# Patient Record
Sex: Male | Born: 1965 | Race: White | Hispanic: No | Marital: Single
Health system: Southern US, Community
[De-identification: ages and names within clinical notes are randomized; demographics above are authoritative.]

---

## 2016-09-01 ENCOUNTER — Encounter: Payer: Self-pay | Admitting: Internal Medicine

## 2016-09-01 ENCOUNTER — Emergency Department: Payer: Medicaid Other

## 2016-09-01 ENCOUNTER — Inpatient Hospital Stay: Payer: Medicaid Other

## 2016-09-01 ENCOUNTER — Inpatient Hospital Stay
Admission: EM | Admit: 2016-09-01 | Discharge: 2016-09-15 | DRG: 291 | Disposition: E | Payer: Medicaid Other | Attending: Internal Medicine | Admitting: Internal Medicine

## 2016-09-01 ENCOUNTER — Inpatient Hospital Stay (HOSPITAL_COMMUNITY)
Admit: 2016-09-01 | Discharge: 2016-09-01 | Disposition: A | Discharge status: Home / Self Care | Payer: Medicaid Other | Attending: Critical Care Medicine | Admitting: Critical Care Medicine

## 2016-09-01 DIAGNOSIS — G931 Anoxic brain damage, not elsewhere classified: Secondary | ICD-10-CM | POA: Diagnosis present

## 2016-09-01 DIAGNOSIS — G9382 Brain death: Secondary | ICD-10-CM | POA: Diagnosis present

## 2016-09-01 DIAGNOSIS — N179 Acute kidney failure, unspecified: Secondary | ICD-10-CM | POA: Diagnosis not present

## 2016-09-01 DIAGNOSIS — I4901 Ventricular fibrillation: Secondary | ICD-10-CM | POA: Diagnosis present

## 2016-09-01 DIAGNOSIS — Z66 Do not resuscitate: Secondary | ICD-10-CM | POA: Diagnosis present

## 2016-09-01 DIAGNOSIS — I469 Cardiac arrest, cause unspecified: Secondary | ICD-10-CM

## 2016-09-01 DIAGNOSIS — J80 Acute respiratory distress syndrome: Secondary | ICD-10-CM | POA: Diagnosis not present

## 2016-09-01 DIAGNOSIS — F17200 Nicotine dependence, unspecified, uncomplicated: Secondary | ICD-10-CM | POA: Diagnosis present

## 2016-09-01 DIAGNOSIS — D696 Thrombocytopenia, unspecified: Secondary | ICD-10-CM | POA: Diagnosis present

## 2016-09-01 DIAGNOSIS — J9621 Acute and chronic respiratory failure with hypoxia: Secondary | ICD-10-CM | POA: Diagnosis present

## 2016-09-01 DIAGNOSIS — R739 Hyperglycemia, unspecified: Secondary | ICD-10-CM | POA: Diagnosis present

## 2016-09-01 DIAGNOSIS — E874 Mixed disorder of acid-base balance: Secondary | ICD-10-CM | POA: Diagnosis present

## 2016-09-01 DIAGNOSIS — R680 Hypothermia, not associated with low environmental temperature: Secondary | ICD-10-CM | POA: Diagnosis present

## 2016-09-01 DIAGNOSIS — F419 Anxiety disorder, unspecified: Secondary | ICD-10-CM | POA: Diagnosis present

## 2016-09-01 DIAGNOSIS — D649 Anemia, unspecified: Secondary | ICD-10-CM | POA: Diagnosis present

## 2016-09-01 DIAGNOSIS — I4510 Unspecified right bundle-branch block: Secondary | ICD-10-CM | POA: Diagnosis present

## 2016-09-01 DIAGNOSIS — G4730 Sleep apnea, unspecified: Secondary | ICD-10-CM | POA: Diagnosis present

## 2016-09-01 DIAGNOSIS — J449 Chronic obstructive pulmonary disease, unspecified: Secondary | ICD-10-CM | POA: Diagnosis present

## 2016-09-01 DIAGNOSIS — R570 Cardiogenic shock: Principal | ICD-10-CM | POA: Diagnosis present

## 2016-09-01 DIAGNOSIS — Z9911 Dependence on respirator [ventilator] status: Secondary | ICD-10-CM | POA: Diagnosis not present

## 2016-09-01 DIAGNOSIS — R945 Abnormal results of liver function studies: Secondary | ICD-10-CM | POA: Diagnosis present

## 2016-09-01 DIAGNOSIS — R34 Anuria and oliguria: Secondary | ICD-10-CM | POA: Diagnosis present

## 2016-09-01 DIAGNOSIS — R579 Shock, unspecified: Secondary | ICD-10-CM

## 2016-09-01 DIAGNOSIS — Z4659 Encounter for fitting and adjustment of other gastrointestinal appliance and device: Secondary | ICD-10-CM

## 2016-09-01 LAB — ETHANOL: Alcohol, Ethyl (B): <5 mg/dL (ref <5)

## 2016-09-01 LAB — COMPREHENSIVE METABOLIC PANEL
ALBUMIN: 3.3 g/dL — AB (ref 3.5–5.0)
ALK PHOS: 102 U/L (ref 38–126)
ALT: 173 U/L — ABNORMAL HIGH (ref 17–63)
AST: 306 U/L — AB (ref 15–41)
Anion gap: 17 — ABNORMAL HIGH (ref 5–15)
BILIRUBIN TOTAL: 0.6 mg/dL (ref 0.3–1.2)
BUN: 9 mg/dL (ref 6–20)
CALCIUM: 9.4 mg/dL (ref 8.9–10.3)
CO2: 41 mmol/L — AB (ref 22–32)
Chloride: 84 mmol/L — ABNORMAL LOW (ref 101–111)
Creatinine, Ser: 1.1 mg/dL (ref 0.61–1.24)
GFR calc Af Amer: >60 mL/min (ref >60)
GFR calc non Af Amer: >60 mL/min (ref >60)
Glucose, Bld: 229 mg/dL — ABNORMAL HIGH (ref 65–99)
Potassium: 4.6 mmol/L (ref 3.5–5.1)
SODIUM: 142 mmol/L (ref 135–145)
TOTAL PROTEIN: 5.8 g/dL — AB (ref 6.5–8.1)

## 2016-09-01 LAB — CBC WITH DIFFERENTIAL/PLATELET
BASOS PCT: 0 %
Band Neutrophils: 0 %
Basophils Absolute: 0 10*3/uL (ref 0–0.1)
Blasts: 0 %
EOS PCT: 1 %
Eosinophils Absolute: 0.1 10*3/uL (ref 0–0.7)
HEMATOCRIT: 40.9 % (ref 40.0–52.0)
HEMOGLOBIN: 11.7 g/dL — AB (ref 13.0–18.0)
LYMPHS ABS: 1.5 10*3/uL (ref 1.0–3.6)
Lymphocytes Relative: 17 %
MCH: 26.8 pg (ref 26.0–34.0)
MCHC: 28.7 g/dL — ABNORMAL LOW (ref 32.0–36.0)
MCV: 93.2 fL (ref 80.0–100.0)
METAMYELOCYTES PCT: 2 %
MONO ABS: 0.5 10*3/uL (ref 0.2–1.0)
MYELOCYTES: 1 %
Monocytes Relative: 6 %
NEUTROS PCT: 73 %
NRBC: 0 /100{WBCs}
Neutro Abs: 6.9 10*3/uL — ABNORMAL HIGH (ref 1.4–6.5)
Other: 0 %
PROMYELOCYTES ABS: 0 %
Platelets: 112 10*3/uL — ABNORMAL LOW (ref 150–440)
RBC: 4.39 MIL/uL — AB (ref 4.40–5.90)
RDW: 18.3 % — ABNORMAL HIGH (ref 11.5–14.5)
WBC: 9 10*3/uL (ref 3.8–10.6)

## 2016-09-01 LAB — GLUCOSE, CAPILLARY
GLUCOSE-CAPILLARY: 339 mg/dL — AB (ref 65–99)
GLUCOSE-CAPILLARY: 253 mg/dL — AB (ref 65–99)
GLUCOSE-CAPILLARY: 270 mg/dL — AB (ref 65–99)
GLUCOSE-CAPILLARY: 114 mg/dL — AB (ref 65–99)
Glucose-Capillary: 118 mg/dL — ABNORMAL HIGH (ref 65–99)
Glucose-Capillary: 191 mg/dL — ABNORMAL HIGH (ref 65–99)
Glucose-Capillary: 368 mg/dL — ABNORMAL HIGH (ref 65–99)

## 2016-09-01 LAB — PROTIME-INR
INR: 1.02
PROTHROMBIN TIME: 13.4 s (ref 11.4–15.2)

## 2016-09-01 LAB — URINE DRUG SCREEN, QUALITATIVE (ARMC ONLY)
AMPHETAMINES, UR SCREEN: NOT DETECTED
BENZODIAZEPINE, UR SCRN: POSITIVE — AB
Barbiturates, Ur Screen: NOT DETECTED
Cannabinoid 50 Ng, Ur ~~LOC~~: NOT DETECTED
Cocaine Metabolite,Ur ~~LOC~~: NOT DETECTED
MDMA (Ecstasy)Ur Screen: NOT DETECTED
METHADONE SCREEN, URINE: NOT DETECTED
Opiate, Ur Screen: NOT DETECTED
PHENCYCLIDINE (PCP) UR S: NOT DETECTED
TRICYCLIC, UR SCREEN: NOT DETECTED

## 2016-09-01 LAB — MRSA PCR SCREENING: MRSA by PCR: NEGATIVE

## 2016-09-01 LAB — TROPONIN I: TROPONIN I: 0.03 ng/mL — AB (ref <0.03)

## 2016-09-01 LAB — ACETAMINOPHEN LEVEL: Acetaminophen (Tylenol), Serum: <10 ug/mL — ABNORMAL LOW (ref 10–30)

## 2016-09-01 LAB — LACTIC ACID, PLASMA
Lactic Acid, Venous: 12.6 mmol/L (ref 0.5–1.9)
Lactic Acid, Venous: 9.4 mmol/L (ref 0.5–1.9)

## 2016-09-01 LAB — SALICYLATE LEVEL: Salicylate Lvl: <7 mg/dL (ref 2.8–30.0)

## 2016-09-01 LAB — ECHOCARDIOGRAM COMPLETE: Weight: 4056.46 oz

## 2016-09-01 LAB — PROCALCITONIN: Procalcitonin: 0.1 ng/mL

## 2016-09-01 LAB — AMMONIA: AMMONIA: 25 umol/L (ref 9–35)

## 2016-09-01 MED ORDER — DEXTROSE 5 % IV SOLN
300.0000 mg | Freq: Once | INTRAVENOUS | Status: AC
  Administered 2016-09-01: 300 mg via INTRAVENOUS
  Filled 2016-09-01: qty 6

## 2016-09-01 MED ORDER — BUDESONIDE 0.25 MG/2ML IN SUSP
0.2500 mg | Freq: Two times a day (BID) | RESPIRATORY_TRACT | Status: DC
  Administered 2016-09-01: 0.25 mg via RESPIRATORY_TRACT
  Filled 2016-09-01: qty 2

## 2016-09-01 MED ORDER — IPRATROPIUM-ALBUTEROL 0.5-2.5 (3) MG/3ML IN SOLN
3.0000 mL | Freq: Four times a day (QID) | RESPIRATORY_TRACT | Status: DC
  Administered 2016-09-01: 3 mL via RESPIRATORY_TRACT
  Filled 2016-09-01: qty 3

## 2016-09-01 MED ORDER — ONDANSETRON HCL 4 MG PO TABS: 4.0000 mg | ORAL_TABLET | Freq: Four times a day (QID) | ORAL | Status: DC | PRN

## 2016-09-01 MED ORDER — NOREPINEPHRINE BITARTRATE 1 MG/ML IV SOLN
0.0000 ug/min | Freq: Once | INTRAVENOUS | Status: AC
  Administered 2016-09-01: 4 ug/min via INTRAVENOUS

## 2016-09-01 MED ORDER — ACETAMINOPHEN 325 MG PO TABS
650.0000 mg | ORAL_TABLET | Freq: Four times a day (QID) | ORAL | Status: DC | PRN
  Administered 2016-09-02 (×2): 650 mg via ORAL
  Filled 2016-09-01 (×2): qty 2

## 2016-09-01 MED ORDER — MAGNESIUM SULFATE 2 GM/50ML IV SOLN
INTRAVENOUS | Status: AC
  Administered 2016-09-01: 04:00:00
  Filled 2016-09-01: qty 50

## 2016-09-01 MED ORDER — ACETAMINOPHEN 650 MG RE SUPP: 650.0000 mg | Freq: Four times a day (QID) | RECTAL | Status: DC | PRN

## 2016-09-01 MED ORDER — SODIUM CHLORIDE 0.9 % IV BOLUS (SEPSIS)
1000.0000 mL | Freq: Once | INTRAVENOUS | Status: AC
  Administered 2016-09-01: 1000 mL via INTRAVENOUS

## 2016-09-01 MED ORDER — NOREPINEPHRINE 4 MG/250ML-% IV SOLN
0.0000 ug/min | INTRAVENOUS | Status: DC
  Administered 2016-09-01: 17 ug/min via INTRAVENOUS

## 2016-09-01 MED ORDER — SODIUM BICARBONATE 8.4 % IV SOLN
INTRAVENOUS | Status: AC | PRN
  Administered 2016-09-01: 100 meq via INTRAVENOUS

## 2016-09-01 MED ORDER — VASOPRESSIN 20 UNIT/ML IV SOLN
0.0300 [IU]/min | INTRAVENOUS | Status: DC
  Administered 2016-09-01 – 2016-09-02 (×2): 0.03 [IU]/min via INTRAVENOUS
  Filled 2016-09-01 (×3): qty 2

## 2016-09-01 MED ORDER — AMIODARONE HCL IN DEXTROSE 360-4.14 MG/200ML-% IV SOLN
60.0000 mg/h | INTRAVENOUS | Status: DC
  Administered 2016-09-01: 60 mg/h via INTRAVENOUS
  Filled 2016-09-01: qty 200

## 2016-09-01 MED ORDER — AMIODARONE HCL IN DEXTROSE 360-4.14 MG/200ML-% IV SOLN
60.0000 mg/h | Freq: Once | INTRAVENOUS | Status: AC
  Administered 2016-09-01: 60 mg/h via INTRAVENOUS

## 2016-09-01 MED ORDER — ATROPINE SULFATE 1 MG/10ML IJ SOSY
1.0000 mg | PREFILLED_SYRINGE | Freq: Once | INTRAMUSCULAR | Status: AC
  Administered 2016-09-01: 1 mg via INTRAVENOUS

## 2016-09-01 MED ORDER — DEXTROSE 5 % IV SOLN: 60.0000 mg/h | Freq: Once | INTRAVENOUS | Status: DC

## 2016-09-01 MED ORDER — ORAL CARE MOUTH RINSE
15.0000 mL | OROMUCOSAL | Status: DC
  Administered 2016-09-01 – 2016-09-02 (×7): 15 mL via OROMUCOSAL

## 2016-09-01 MED ORDER — DOPAMINE-DEXTROSE 3.2-5 MG/ML-% IV SOLN
INTRAVENOUS | Status: AC
  Administered 2016-09-01: 20 ug/kg/min via INTRAVENOUS
  Filled 2016-09-01: qty 250

## 2016-09-01 MED ORDER — IPRATROPIUM-ALBUTEROL 0.5-2.5 (3) MG/3ML IN SOLN: 3.0000 mL | Freq: Four times a day (QID) | RESPIRATORY_TRACT | Status: DC | PRN

## 2016-09-01 MED ORDER — SODIUM CHLORIDE 0.9% FLUSH
3.0000 mL | Freq: Two times a day (BID) | INTRAVENOUS | Status: DC
  Administered 2016-09-01 (×2): 3 mL via INTRAVENOUS

## 2016-09-01 MED ORDER — ONDANSETRON HCL 4 MG/2ML IJ SOLN: 4.0000 mg | Freq: Four times a day (QID) | INTRAMUSCULAR | Status: DC | PRN

## 2016-09-01 MED ORDER — DOPAMINE-DEXTROSE 3.2-5 MG/ML-% IV SOLN
0.0000 ug/kg/min | INTRAVENOUS | Status: DC
  Administered 2016-09-01 (×3): 20 ug/kg/min via INTRAVENOUS
  Filled 2016-09-01 (×2): qty 250

## 2016-09-01 MED ORDER — NOREPINEPHRINE BITARTRATE 1 MG/ML IV SOLN
0.0000 ug/min | INTRAVENOUS | Status: DC
  Administered 2016-09-01 (×2): 15 ug/min via INTRAVENOUS
  Administered 2016-09-01: 30 ug/min via INTRAVENOUS
  Administered 2016-09-01: 8 ug/min via INTRAVENOUS
  Administered 2016-09-02: 15 ug/min via INTRAVENOUS
  Administered 2016-09-02: 12 ug/min via INTRAVENOUS
  Filled 2016-09-01 (×6): qty 16

## 2016-09-01 MED ORDER — METHYLPREDNISOLONE SODIUM SUCC 40 MG IJ SOLR
40.0000 mg | Freq: Two times a day (BID) | INTRAMUSCULAR | Status: DC
  Administered 2016-09-01: 40 mg via INTRAVENOUS
  Filled 2016-09-01: qty 1

## 2016-09-01 MED ORDER — FAMOTIDINE IN NACL 20-0.9 MG/50ML-% IV SOLN
20.0000 mg | INTRAVENOUS | Status: DC
  Administered 2016-09-02: 20 mg via INTRAVENOUS
  Filled 2016-09-01: qty 50

## 2016-09-01 MED ORDER — SODIUM CHLORIDE 0.9% FLUSH: 10.0000 mL | INTRAVENOUS | Status: DC | PRN

## 2016-09-01 MED ORDER — CHLORHEXIDINE GLUCONATE 0.12% ORAL RINSE (MEDLINE KIT)
15.0000 mL | Freq: Two times a day (BID) | OROMUCOSAL | Status: DC
  Administered 2016-09-01 – 2016-09-02 (×3): 15 mL via OROMUCOSAL

## 2016-09-01 MED ORDER — IPRATROPIUM-ALBUTEROL 0.5-2.5 (3) MG/3ML IN SOLN
3.0000 mL | Freq: Four times a day (QID) | RESPIRATORY_TRACT | Status: DC
  Administered 2016-09-01 – 2016-09-02 (×4): 3 mL via RESPIRATORY_TRACT
  Filled 2016-09-01 (×5): qty 3

## 2016-09-01 MED ORDER — SODIUM CHLORIDE 0.9% FLUSH
10.0000 mL | Freq: Two times a day (BID) | INTRAVENOUS | Status: DC
  Administered 2016-09-01: 10 mL
  Administered 2016-09-01: 40 mL

## 2016-09-01 MED ORDER — AMIODARONE HCL IN DEXTROSE 360-4.14 MG/200ML-% IV SOLN
INTRAVENOUS | Status: AC
  Administered 2016-09-01: 60 mg/h via INTRAVENOUS
  Filled 2016-09-01: qty 200

## 2016-09-01 MED ORDER — NOREPINEPHRINE 4 MG/250ML-% IV SOLN
INTRAVENOUS | Status: AC
  Filled 2016-09-01: qty 250

## 2016-09-01 MED ORDER — DEXTROSE 5 % IV SOLN: 0.0000 ug/min | INTRAVENOUS | Status: DC

## 2016-09-01 MED ORDER — IPRATROPIUM-ALBUTEROL 0.5-2.5 (3) MG/3ML IN SOLN: 3.0000 mL | RESPIRATORY_TRACT | Status: DC

## 2016-09-01 MED ORDER — SODIUM CHLORIDE 0.9 % IV SOLN
INTRAVENOUS | Status: DC
  Administered 2016-09-01 (×2): via INTRAVENOUS

## 2016-09-01 MED ORDER — INSULIN ASPART 100 UNIT/ML ~~LOC~~ SOLN
0.0000 [IU] | SUBCUTANEOUS | Status: DC
  Administered 2016-09-01: 15 [IU] via SUBCUTANEOUS
  Administered 2016-09-01 (×2): 8 [IU] via SUBCUTANEOUS
  Administered 2016-09-02: 2 [IU] via SUBCUTANEOUS
  Filled 2016-09-01: qty 2
  Filled 2016-09-01: qty 8
  Filled 2016-09-01: qty 15
  Filled 2016-09-01: qty 8

## 2016-09-01 NOTE — ED Notes (Signed)
Report to CCU RN.

## 2016-09-01 NOTE — Progress Notes (Addendum)
[facility] notified of patient and poor prognosis. CDS made assessment of patient and will be called to confirm when patient passes.  Referral number [id number].

## 2016-09-01 NOTE — Progress Notes (Signed)
Inpatient Diabetes Program Recommendations  AACE/ADA: New Consensus Statement on Inpatient Glycemic Control (2015)  Target Ranges:  Prepandial:   less than 140 mg/dL      Peak postprandial:   less than 180 mg/dL (1-2 hours)      Critically ill patients:  140 - 180 mg/dL   Lab Results  Component Value Date   GLUCAP 368 (H) 08/17/2016    Review of Glycemic Control  Results for BAYANI, RENTERIA (MRN [id number]) as of 09/12/2016 13:35  Ref. Range 09/14/2016 04:25 08/28/2016 06:30 08/18/2016 08:53  Glucose-Capillary Latest Ref Range: 65 - 99 mg/dL 578 (H) 469 (H) 629 (H)   Diabetes history: unknown Outpatient Diabetes medications: none Current orders for Inpatient glycemic control: Novolog 0-15 untis q4h  Inpatient Diabetes Program Recommendations:  Based on current blood sugars,  consider starting the patient on ICU Glycemic Control Phase 2,  IV insulin order set.   Per ADA recommendations "consider performing an A1C on all patients with diabetes or hyperglycemia admitted to the hospital if not performed in the prior 3 months".   Susette Racer, RN, BA, MHA, CDE Diabetes Coordinator Inpatient Diabetes Program  [phone] (Team Pager) [phone] [facility] Office) 08/30/2016 1:42 PM

## 2016-09-01 NOTE — Progress Notes (Signed)
*  PRELIMINARY RESULTS* Echocardiogram 2D Echocardiogram has been performed.  Cristela Blue 08/30/2016, 12:00 PM

## 2016-09-01 NOTE — Consult Note (Signed)
PULMONARY / CRITICAL CARE MEDICINE   Name: Colton Dillon MRN: [id number] DOB: 04/30/1966    ADMISSION DATE:  08/19/2016 CONSULTATION DATE:  09/08/2016  REFERRING MD:  Dr. Estanislado Pandy   CHIEF COMPLAINT: Cardiac Arrest   HISTORY OF PRESENT ILLNESS:   This a 51 yo male with PMH of COPD, Current everyday smoker, Transitional cell cancer, Polysubstance abuse, Anxiety, Centrilobular emphysema, IV drug use, Obesity, and Aspiration Pneumonia, and Bipap qhs.  Pt presented to [facility] ER 04/17 following a fall at home witnessed by his roommate at which time he hit his head.  EMS was notified and upon arrival the pt was found having agonal respirations and total downtime was unknown.  He was in a PEA arrest EMS initiated ALS protocol at 01:32 am he received a total of 7 epinephrine and 2 narcan.  After 1 hour of ALS the pt remained in PEA, therefore EMS notified medical command and was instructed to cease CPR.  However, when CPR was stopped it was noted the pt was in a ventricular fibrillation and EMS defibrillated the pt and continued CPR with ROSC.  The pt was subsequently started on a Dopamine gtt at 15 mcg/kg en route to the ER with bp of 93/51.  Upon arrival to the ER the pt was mechanically intubated and amiodarone gtt was initiated.  In the ER the pt became bradycardic hr 30's and pulseless, therefore CPR initiated and pt received 1 mg atropine with ROSC 4 minutes following initiation of CPR and levophed gtt initiated due to hypotension.  EMS informed the ER physician police were on the scene at the pts home and were actively investigating a potential homicide.  It was reported the pt was unresponsive for 1 hour before pts roommate called 911.  The pt was subsequently admitted to ICU s/p cardiac arrest with acute hypercapnic respiratory and metabolic acidosis and lactic acidosis requiring mechanical intubation and acute encephalopathy following hitting his head s/p fall.    PAST MEDICAL HISTORY :  He  has no past  medical history on file.  PAST SURGICAL HISTORY: He  has no past surgical history on file.  Allergies not on file  No current facility-administered medications on file prior to encounter.    No current outpatient prescriptions on file prior to encounter.    FAMILY HISTORY:  His has no family status information on file.    SOCIAL HISTORY: He    REVIEW OF SYSTEMS:   Unable to assess pt intubated   SUBJECTIVE:  Unable to assess pt intubated   VITAL SIGNS: BP 96/62   Pulse 76   Resp (!) 22   SpO2 100%   HEMODYNAMICS:    VENTILATOR SETTINGS: Vent Mode: AC FiO2 (%):  [60 %] 60 % Set Rate:  [20 bmp] 20 bmp Vt Set:  [500 mL] 500 mL PEEP:  [5 cmH20] 5 cmH20  INTAKE / OUTPUT: No intake/output data recorded.  PHYSICAL EXAMINATION: General:  Acutely ill Caucasian male mechanically intubated  Neuro: unresponsive, pupils unequal and non reactive, not following commands  HEENT: supple, no JVD Cardiovascular: nsr, s1s2, rrr, no M/R/G Lungs: faint rhonchi throughout, even, non labored mechanically ventilated  Abdomen: faint BS x4, obese, taut, slight distended  Musculoskeletal: 2+ bilateral lower extremity pitting edema  Skin: cool to touch, bilateral lower extremities cyanotic   LABS:  BMET  Recent Labs Lab 09/03/2016 0347  NA 142  K 4.6  CL 84*  CO2 41*  BUN 9  CREATININE 1.10  GLUCOSE 229*  Electrolytes  Recent Labs Lab 08/29/2016 0347  CALCIUM 9.4    CBC  Recent Labs Lab 09/08/2016 0347  WBC 9.0  HGB 11.7*  HCT 40.9  PLT 112*    Coag's  Recent Labs Lab 09/04/2016 0347  INR 1.02    Sepsis Markers  Recent Labs Lab 09/11/2016 0348  LATICACIDVEN 12.6*    ABG  Recent Labs Lab 08/28/2016 0347  PHART 7.01*  PCO2ART 120*  PO2ART 202*    Liver Enzymes  Recent Labs Lab 08/24/2016 0347  AST 306*  ALT 173*  ALKPHOS 102  BILITOT 0.6  ALBUMIN 3.3*    Cardiac Enzymes  Recent Labs Lab 08/23/2016 0347  TROPONINI 0.03*     Glucose  Recent Labs Lab 09/10/2016 0425  GLUCAP 191*    Imaging Dg Chest Port 1 View  Result Date: 09/07/2016 CLINICAL DATA:  Patient fell, striking head. Arrest with CPR. Ventricular fibrillation. Endotracheal tube placement. EXAM: PORTABLE CHEST 1 VIEW COMPARISON:  None. FINDINGS: Endotracheal tube placed with tip measuring 6.5 cm above the carina. Heart size and pulmonary vascularity are normal. Emphysematous changes in the upper lungs. Linear scarring or atelectasis in the right mid lung. No pneumothorax. No blunting of costophrenic angles. No focal consolidation. IMPRESSION: Endotracheal tube is present with tip measuring 6.5 cm above the carina. Emphysematous changes in the lungs. Linear fibrosis or atelectasis in the right mid lung. Electronically Signed   By: Lucienne Capers M.D.   On: 08/26/2016 04:14   STUDIES:  CT Head 04/17>>  CULTURES: None   ANTIBIOTICS: None   SIGNIFICANT EVENTS: 04/17-Pt admitted to ICU s/p cardiac arrest with acute respiratory failure secondary to metabolic, respiratory, and lactic acidosis requiring mechanical intubation and acute encephalopathy following hitting his head s/p fall   LINES/TUBES: ETT 04/17>> Right femoral CVL 04/17>>  ASSESSMENT / PLAN:  PULMONARY A: S/p cardiac arrest with acute respiratory failure secondary to metabolic, respiratory, and lactic acidosis Mechanical Intubation Hx: COPD, Bipap qhs, Aspiration Pneumonia, Centrilobular Emphysema, and Current everyday smoker  P:   Full vent support wean as tolerated SBT once parameters met  VAP bundle  Bronchodilator therapy Does not meet criteria for hypothermia protocol due to unknown downtime and 1 hour of CPR before ROSC Will maintain normothermia  Repeat ABG today  CXR in am   CARDIOVASCULAR A:  S/p PEA and Vfibb arrest of unknown etiology Hypotension likely secondary to cardiogenic shock s/p cardiac arrest  P:  Trend troponin's Fluid resuscitation Prn  Dopamine and Levophed gtt to maintain map >65 Continue amiodarone gtt  Echo pending Cardiology consulted appreciate input   RENAL A:   Metabolic acidosis  Lactic acidosis  P:   Trend BMP Replace electrolytes as indicated Trend lactic acid  Monitor UOP   GASTROINTESTINAL A:   No acute issues  P:   Pepcid for PUD prophylaxis  Will initiate tube feeds   HEMATOLOGIC A:   Mild anemia without acute blood loss  Thrombocytopenia  Elevated liver enzymes likely secondary to hypotension  P:  SCD's for VTE prophylaxis will start chemical prophylaxis once CT head results obtained Trend CBC Monitor for s/sx of bleeding Transfuse for hgb <7  INFECTIOUS A:   No acute issues  P:   Trend WBC and monitor fever curve Trend PCT's and lactic acid UA pending Will obtain cultures and start prophylactic abx if PCT's elevated   ENDOCRINE A:   Hyperglycemia  P:   CBG's q4hrs SSI   NEUROLOGIC A:   Acute encephalopathy following hitting  his head s/p fall Hx: IV drug use and Anxiety  P:   RASS goal: 0 to -1 CT head pending EEG pending  Obtain ammonia level and urine drug screen Will consult neurology if pt remains unresponsive  WUA daily   FAMILY  - Updates: No family at bedside to update there are no numbers listed in medical record, therefore unable to contact family at this time 09/03/2016.  Overall prognosis very poor will attempt to obtain contact information for the pt by speaking with care management team or police department.    - Inter-disciplinary family meet or Palliative Care meeting due by:  09/08/2016  Marda Stalker, [place] Pager [phone] (please enter 7 digits) [place] Pager [phone] (please enter 7 digits)   PCCM ATTENDING ATTESTATION:  I have evaluated patient with the APP [place], reviewed database in its entirety and discussed care plan in detail. In addition, this patient was discussed on multidisciplinary rounds.    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 I reviewed the circumstances of his admission. I conveyed the likely diagnosis of brain death. However, I cannot make that diagnosis with certainty while he remains hypothermic. We agreed to not escalate our current efforts. As he is rewarmed, if his exam remains consistent with brain death, the plan will be to discontinue mechanical ventilation on the day following this evaluation. In the meantime, he is DNR in the event of recurrent arrest  CCM time: 45 minutes  Merton Border, MD PCCM service Mobile [phone] Pager [phone] 09/07/2016 2:30 PM

## 2016-09-01 NOTE — ED Notes (Signed)
Trop 0.03 called from lab. MD notified.

## 2016-09-01 NOTE — Procedures (Signed)
Central Venous Catheter Insertion Procedure Note Louay Myrie [id number] 10-20-1965  Procedure: Insertion of Central Venous Catheter Indications: Assessment of intravascular volume, Drug and/or fluid administration and Frequent blood sampling  Procedure Details Consent: Unable to obtain consent because of emergent medical necessity. Time Out: Verified patient identification, verified procedure, site/side was marked, verified correct patient position, special equipment/implants available, medications/allergies/relevent history reviewed, required imaging and test results available.  Performed  Maximum sterile technique was used including antiseptics, cap, gloves, gown, hand hygiene, mask and sheet. Skin prep: Chlorhexidine; local anesthetic administered A antimicrobial bonded/coated triple lumen catheter was placed in the right femoral vein due to emergent situation using the Seldinger technique.  Evaluation Blood flow good Complications: No apparent complications Patient did tolerate procedure well. Chest X-ray ordered to verify placement.  CXR: not indicated.  Right femoral central line placed emergently utilizing ultrasound no complications noted during or following procedure.  Sonda Rumble, AGNP  Pulmonary/Critical Care Pager [phone] (please enter 7 digits) PCCM Consult Pager [phone] (please enter 7 digits)  Billy Fischer, MD PCCM service Mobile [phone] Pager [phone] 09/18/2016

## 2016-09-01 NOTE — Progress Notes (Signed)
[facility] - [place] [facility]   PATIENT NAME: Colton Dillon    MR#:  [id number]  DATE OF BIRTH:  July 29, 1965  SUBJECTIVE:    REVIEW OF SYSTEMS:   ROS Tolerating Diet: Tolerating PT:   DRUG ALLERGIES:  Allergies no known allergies  VITALS:  Blood pressure 91/70, pulse (!) 105, temperature 97.7 F (36.5 C), resp. rate 20, weight 115 kg (253 lb 8.5 oz), SpO2 98 %.  PHYSICAL EXAMINATION:   Physical Exam  GENERAL:  51 y.o.-year-old patient lying in the bed with no acute distress.  EYES: Pupils equal, round, reactive to light and accommodation. No scleral icterus. Extraocular muscles intact.  HEENT: Head atraumatic, normocephalic. Oropharynx and nasopharynx clear.  NECK:  Supple, no jugular venous distention. No thyroid enlargement, no tenderness.  LUNGS: Normal breath sounds bilaterally, no wheezing, rales, rhonchi. No use of accessory muscles of respiration.  CARDIOVASCULAR: S1, S2 normal. No murmurs, rubs, or gallops.  ABDOMEN: Soft, nontender, nondistended. Bowel sounds present. No organomegaly or mass.  EXTREMITIES: No cyanosis, clubbing or edema b/l.    NEUROLOGIC: Cranial nerves II through XII are intact. No focal Motor or sensory deficits b/l.   PSYCHIATRIC:  patient is alert and oriented x 3.  SKIN: No obvious rash, lesion, or ulcer.   LABORATORY PANEL:  CBC  Recent Labs Lab 08/31/2016 0347  WBC 9.0  HGB 11.7*  HCT 40.9  PLT 112*    Chemistries   Recent Labs Lab 08/16/2016 0347  NA 142  K 4.6  CL 84*  CO2 41*  GLUCOSE 229*  BUN 9  CREATININE 1.10  CALCIUM 9.4  AST 306*  ALT 173*  ALKPHOS 102  BILITOT 0.6   Cardiac Enzymes  Recent Labs Lab 09/07/2016 0347  TROPONINI 0.03*   RADIOLOGY:  Ct Head Wo Contrast  Result Date: 09/01/2016 CLINICAL DATA:  Initial evaluation for acute trauma, fall. None known down time. PEA arrest with CPR. EXAM: CT HEAD WITHOUT CONTRAST TECHNIQUE: Contiguous axial images were obtained from  the base of the skull through the vertex without intravenous contrast. COMPARISON:  None available. FINDINGS: Brain: Diffuse loss of cortical sulcation with poor differentiation of the gray-white matter differentiation, highly concerning for global cerebral edema, suspected to be related to diffuse anoxic injury. Ventricles remain relatively preserved at this time. Cerebellar tonsils mildly low lying within the foramen magnum, which may reflect early/ impinging trans tentorial herniation. Crowding of the basilar cisterns. Apparent diffuse hyperdensity involving the subarachnoid spaces favored to reflect pseudo subarachnoid hemorrhage related to global cerebral anoxia rather than acute blood. No evidence for acute parenchymal hemorrhage. No intraventricular hemorrhage. No extra-axial fluid collection. No mass lesion or midline shift. No hydrocephalus. No findings to suggest acute large vessel territory infarct. Vascular: Intracranial vascular root sure diffusely hyperdense, likely related to anoxia. No asymmetric hyperdense vessel. Vascular calcifications noted within the carotid siphons. Skull: Scalp soft tissues demonstrate no acute abnormality. Calvarium intact. Sinuses/Orbits: Globes and orbital soft tissues within normal limits. Scattered opacity with mucosal thickening throughout the visualized paranasal sinuses. Small bilateral mastoid effusions, right greater than left. IMPRESSION: 1. Diffuse loss of cortical sulcation and gray-white matter differentiation, highly concerning for global cerebral edema. Associated basilar cistern crowding with the cerebellar tonsils low lying within the foramen magnum, concerning for early transtentorial herniation. 2. Diffuse hyperdensity throughout the subarachnoid spaces, favored to reflect "pseudo subarachnoid hemorrhage" related to global cerebral edema. No definite acute intracranial hemorrhage identified. Critical Value/emergent results were called by telephone at the  time of interpretation on 09/12/2016 at 6:37 am to the nurse practitioner taking care of the patient, Sonda Rumble, who verbally acknowledged these results. Electronically Signed   By: Rise Mu M.D.   On: 08/29/2016 06:42   Dg Chest Portable 1 View  Result Date: 08/30/2016 CLINICAL DATA:  Acute respiratory distress EXAM: PORTABLE CHEST 1 VIEW COMPARISON:  09/14/2016 FINDINGS: Endotracheal tube with tip measuring 5.9 cm above the carina. Normal heart size and pulmonary vascularity. Emphysematous changes in the lungs with scattered fibrosis. Linear fibrosis or atelectasis in the right mid lung. No blunting of costophrenic angles. No pneumothorax. No change since prior study. IMPRESSION: Appliances appear in satisfactory position. Emphysematous changes in the lungs with scattered fibrosis. No change. Electronically Signed   By: Burman Nieves M.D.   On: 09/09/2016 06:12   Dg Chest Port 1 View  Result Date: 09/04/2016 CLINICAL DATA:  Patient fell, striking head. Arrest with CPR. Ventricular fibrillation. Endotracheal tube placement. EXAM: PORTABLE CHEST 1 VIEW COMPARISON:  None. FINDINGS: Endotracheal tube placed with tip measuring 6.5 cm above the carina. Heart size and pulmonary vascularity are normal. Emphysematous changes in the upper lungs. Linear scarring or atelectasis in the right mid lung. No pneumothorax. No blunting of costophrenic angles. No focal consolidation. IMPRESSION: Endotracheal tube is present with tip measuring 6.5 cm above the carina. Emphysematous changes in the lungs. Linear fibrosis or atelectasis in the right mid lung. Electronically Signed   By: Burman Nieves M.D.   On: 09/14/2016 04:14   Dg Abd Portable 1v  Result Date: 09/01/2016 CLINICAL DATA:  Assess feeding tube positioning. EXAM: PORTABLE ABDOMEN - 1 VIEW COMPARISON:  None in PACs FINDINGS: There is an esophagogastric tube present. The proximal port lies in the region of the gastric cardia and the tip in the  mid gastric fundus. IMPRESSION: No feeding tube is visible. An esophagogastric tube is visible with positioning as described. Electronically Signed   By: David  [place] M.D.   On: 09/01/2016 07:56   ASSESSMENT AND PLAN:  Noah Lembke  is a  51 yr old male patient with unknown past medical history presented to the emergency room after he was picked up by the EMS for unresponsiveness after a fall. Patient lives with a roommate. The down time after fall is not known,When EMS arrived at the scene, patient was having agonal breathing. ACLS protocol was run and patient was resuscitated for cardiac arrest. Initially patient had PEA rhythm and patient was resuscitated. Later on his rhythm changed to V. fib and he was shocked by the EMS in the field  1. Cardiorespiratory arrest, acute at home -Patient currently intubated on the ventilator -Oral prognosis appears poor given multiorgan failure and CT head suggestive of anoxic brain damage -Code ICE was initiated now is being rewarmed -Discussion was made by ICU attending with patient's sister and nieces and ex-girlfriend  -They understand poor prognosis. Patient is now a DO NOT RESUSCITATE   2. Cardiogenic shock with cardiorespiratory arrest -Seen by cardiology recommends no further workup at present given poor prognosis  3. Morbid obesity with history of COPD with ongoing tobacco abuse  4. Metabolic acidosis/respiratory acidosis with 1.  5. Acute renal failure secondary to #1  Oral poor prognosis.  Case discussed with Care Management/Social Worker. Management plans discussed with the patient, family and they are in agreement.  CODE STATUS: DO NOT RESUSCITATE     TOTAL TIME TAKING CARE OF THIS PATIENT: 30tes.  >50% time spent on counselling and coordination  of care Girlfriend and nieces   POSSIBLE D/C IN 1-2 DAYS, DEPENDING ON CLINICAL CONDITION.  Note: This dictation was prepared with Dragon dictation along with smaller phrase technology. Any  transcriptional errors that result from this process are unintentional.  Ashlynne Shetterly M.D on 2016-09-27 at 3:31 PM  Between 7am to 6pm - Pager - [phone]  After 6pm go to [email or web address] - Social research officer, government  [facility]  Office  [phone]  CC: Primary care physician; No PCP Per Patient

## 2016-09-01 NOTE — ED Notes (Signed)
Arrival. NSR 90s, king airway in place. IO access, lt tib.   Witnessed fall-hit head, unknown downtime.Marland KitchenMarland KitchenFire arrived, CPR initiated agonal RR at 0132. Pt maintain PEA with CPR, call it after 40 minutes, vfib, shock, NSR. 7 Epi on scene, 2 narcan. 93/51 in route. Dopamine at 15 mcg/kg

## 2016-09-01 NOTE — Progress Notes (Signed)
eLink Physician-Brief Progress Note Patient Name: Colton Dillon DOB: [birth year] MRN: [id number]   Date of Service  09/05/2016  HPI/Events of Note  New patient evaluation. Patient is a cardiac arrest patient. Prolonged downtime. Questionable fall. Police actively looking into the case also as there might be homicide involved.   Intubated for airway protection. Started on levo fed drip for hypotension. Amiodarone drip was also started for arrhythmia.   Patient just transferred to the ICU. Blood pressure 90/60, heart rate 90, respiratory 20, sats of 98% on 45% FiO2.   No family around. They have been trying to get hold of family.   Labs reviewed. ABG with hypercapnia. Also with significant lactic acidosis secondary to prolonged downtime. Troponin was only mildly elevated.   eICU Interventions  Continue present management.  Repeat ABG this morning. If with persistent hypercapnia, we'll need to just the ventilator. Continue IV fluids  Continue pressors.  We'll need to let case manager be involved to look for family.      Intervention Category Evaluation Type: New Patient Evaluation  Louann Sjogren 08/23/2016, 6:48 AM

## 2016-09-01 NOTE — ED Notes (Signed)
LA called from lab reported to NP

## 2016-09-01 NOTE — Progress Notes (Signed)
Initial Nutrition Assessment  DOCUMENTATION CODES:   Obesity unspecified  INTERVENTION:  1. No interventions warranted at this time.  NUTRITION DIAGNOSIS:   Inadequate oral intake related to inability to eat as evidenced by NPO status.  GOAL:   Provide needs based on ASPEN/SCCM guidelines  MONITOR:   TF tolerance, I & O's, Labs, Weight trends, Vent status  REASON FOR ASSESSMENT:   Ventilator    ASSESSMENT:   Colton Dillon  is a  51 yr old male patient with unknown past medical history presented to the emergency room after he was picked up by the EMS for unresponsiveness after a fall. Cardiac arrest with prolonged downtime. Currently rewarming.  Patient is currently intubated on ventilator support MV: 5.5 L/min Temp (24hrs), Avg:88 F (31.1 C), Min:86.5 F (30.3 C), Max:90 F (32.2 C) Propofol: none Poor prognosis per CCU MD. Having difficulty warming patient to 37C Possibly withdraw care tomorrow per rounds. Labs and medications reviewed.  Diet Order:  Diet NPO time specified  Skin:  Reviewed, no issues  Last BM:  PTA  Height:   Ht Readings from Last 1 Encounters:  No data found for Ht  6'0 (182.9 cm) per previous notes  Weight:   Wt Readings from Last 1 Encounters:  08/28/2016 253 lb 8.5 oz (115 kg)    Ideal Body Weight:  80.9 kg  BMI:  There is no height or weight on file to calculate BMI. 34.7  Estimated Nutritional Needs:   Kcal:  1265-1610 calories  Protein:  162 gm  Fluid:  Per MD/NP/PA  EDUCATION NEEDS:   Education needs no appropriate at this time  Dionne Ano. Erikah Thumm, MS, RD LDN Inpatient Clinical Dietitian Pager [phone]

## 2016-09-01 NOTE — ED Provider Notes (Signed)
[facility] Emergency Department Provider Note   ____________________________________________   First MD Initiated Contact with Patient 09/15/16 0345     (approximate)  I have reviewed the triage vital signs and the nursing notes.   HISTORY  Chief Complaint CPR  History obtained via EMS  HPI Colton Dillon is a 51 y.o. male brought to the ED from home status post CPR. Per medics, EMS was called out to the residents for fall. Patient was found with agonal respirations, unknown down time upon EMS arrival. He was in PEA arrest and EMS began resuscitation at 0132. After one hour of PEA arrest and patient was given 2 Narcan, 4 Epi, they called medical command and was instructed to cease CPR. When they did, they noted ventricular fibrillation on the monitor and defibrillated the patient. Since defibrillation, EMS has alternated doing CPR with return of spontaneous circulation. At the time of arrival, patient has received 7 total epi and has had spontaneous pulse for 20 minutes.  Of note, EMS also tells me police were on scene and actively investigating this as a potential homicide. Reportedly the patient's roommate waited one hour of patient being unresponsive before calling for help.   Past medical history Unknown  Patient Active Problem List   Diagnosis Date Noted  . Cardiac arrest (HCC) 2016-09-15    No past surgical history on file.  Prior to Admission medications   Not on File    Allergies Patient has no allergy information on record.  Family History  Problem Relation Age of Onset  . Family history unknown: Yes    Social History Social History  Substance Use Topics  . Smoking status: Not on file  . Smokeless tobacco: Not on file  . Alcohol use Not on file  Unknown  Review of Systems  Constitutional: No fever/chills. Eyes: No visual changes. ENT: No sore throat. Cardiovascular: Denies chest pain. Respiratory: Denies shortness of  breath. Gastrointestinal: No abdominal pain.  No nausea, no vomiting.  No diarrhea.  No constipation. Genitourinary: Negative for dysuria. Musculoskeletal: Negative for back pain. Skin: Negative for rash. Neurological: Negative for headaches, focal weakness or numbness.  Unknown 10-point ROS.  ____________________________________________   PHYSICAL EXAM:  VITAL SIGNS: ED Triage Vitals  Enc Vitals Group     BP      Pulse      Resp      Temp      Temp src      SpO2      Weight      Height      Head Circumference      Peak Flow      Pain Score      Pain Loc      Pain Edu?      Excl. in GC?     Constitutional: Unresponsive, cold and mottled.  Eyes: Fixed and dilated. Head: Atraumatic. Neck: No obvious step-offs or deformities. Cardiovascular: Bradycardic rate. Thready pulse. Respiratory: Being bagged via King airway. Gastrointestinal: Moderate distention. Musculoskeletal: Right elbow muddy and wet. Mottled extremities.  Neurologic:  Unresponsive.  Skin:  Skin is warm, dry and intact. No rash noted. Psychiatric: Unable to assess. ____________________________________________   LABS (all labs ordered are listed, but only abnormal results are displayed)  Labs Reviewed  CBC WITH DIFFERENTIAL/PLATELET - Abnormal; Notable for the following:       Result Value   RBC 4.39 (*)    Hemoglobin 11.7 (*)    MCHC 28.7 (*)  RDW 18.3 (*)    Platelets 112 (*)    Neutro Abs 6.9 (*)    All other components within normal limits  COMPREHENSIVE METABOLIC PANEL - Abnormal; Notable for the following:    Chloride 84 (*)    CO2 41 (*)    Glucose, Bld 229 (*)    Total Protein 5.8 (*)    Albumin 3.3 (*)    AST 306 (*)    ALT 173 (*)    Anion gap 17 (*)    All other components within normal limits  ACETAMINOPHEN LEVEL - Abnormal; Notable for the following:    Acetaminophen (Tylenol), Serum <10 (*)    All other components within normal limits  TROPONIN I - Abnormal; Notable for the  following:    Troponin I 0.03 (*)    All other components within normal limits  LACTIC ACID, PLASMA - Abnormal; Notable for the following:    Lactic Acid, Venous 12.6 (*)    All other components within normal limits  BLOOD GAS, ARTERIAL - Abnormal; Notable for the following:    pH, Arterial 7.01 (*)    pCO2 arterial 120 (*)    pO2, Arterial 202 (*)    All other components within normal limits  GLUCOSE, CAPILLARY - Abnormal; Notable for the following:    Glucose-Capillary 191 (*)    All other components within normal limits  GLUCOSE, CAPILLARY - Abnormal; Notable for the following:    Glucose-Capillary 339 (*)    All other components within normal limits  MRSA PCR SCREENING  SALICYLATE LEVEL  PROTIME-INR  ETHANOL  LACTIC ACID, PLASMA  URINALYSIS, COMPLETE (UACMP) WITH MICROSCOPIC  URINE DRUG SCREEN, QUALITATIVE ([facility] ONLY)  PROCALCITONIN  AMMONIA  BLOOD GAS, ARTERIAL  TYPE AND SCREEN   ____________________________________________  EKG  ED ECG REPORT I, Eidan Muellner J, the attending physician, personally viewed and interpreted this ECG.   Date: 09/05/2016  EKG Time: 0403  Rate: 72  Rhythm: normal EKG, normal sinus rhythm  Axis: LAD  Intervals:left posterior fascicular block. RBBB  ST&T Change: Diffuse ST depression  ____________________________________________  RADIOLOGY  Portable chest x-ray (viewed by me, interpreted per Dr. Andria Meuse): Endotracheal tube is present with tip measuring 6.5 cm above the  carina. Emphysematous changes in the lungs. Linear fibrosis or  atelectasis in the right mid lung.   ____________________________________________   PROCEDURES  Procedure(s) performed:   INTUBATION Performed by: Irean Hong  Required items: required blood products, implants, devices, and special equipment available Patient identity confirmed: provided demographic data and hospital-assigned identification number Time out: Immediately prior to procedure a "time  out" was called to verify the correct patient, procedure, equipment, support staff and site/side marked as required.  Indications: CPR  Intubation method: Glidescope Laryngoscopy   Preoxygenation: BVM via King airway   Tube Size: 7.5 cuffed  Post-procedure assessment: chest rise and ETCO2 monitor Breath sounds: equal and absent over the epigastrium Tube secured with: ETT holder Chest x-ray interpreted by radiologist and me.  Chest x-ray findings: endotracheal tube in appropriate position  Patient tolerated the procedure well with no immediate complications.    Procedures  Critical Care performed: Yes, see critical care note(s)   CRITICAL CARE Performed by: Irean Hong   Total critical care time: 45 minutes  Critical care time was exclusive of separately billable procedures and treating other patients.  Critical care was necessary to treat or prevent imminent or life-threatening deterioration.  Critical care was time spent personally by me on the following activities:  development of treatment plan with patient and/or surrogate as well as nursing, discussions with consultants, evaluation of patient's response to treatment, examination of patient, obtaining history from patient or surrogate, ordering and performing treatments and interventions, ordering and review of laboratory studies, ordering and review of radiographic studies, pulse oximetry and re-evaluation of patient's condition.  ____________________________________________   INITIAL IMPRESSION / ASSESSMENT AND PLAN / ED COURSE  Pertinent labs & imaging results that were available during my care of the patient were reviewed by me and considered in my medical decision making (see chart for details).  51 year old male with unknown medical history who presents to the ED status post arrest. By the time of patient's arrival, EMS had resuscitated him for almost 2 hours in the field. At this time, patient has spontaneous  circulation. Will maintain dopamine drip, add amiodarone and magnesium given V. fib rhythm with defibrillation in the field. Will obtain CT head to evaluate for intracranial hemorrhage. Discussed with hospitalist who will evaluate patient in the emergency department for admission. There is no family at bedside currently.  Clinical Course as of Sep 02 714  Tue Sep 01, 2016  9604 Patient became bradycardic to the 20s and pulseless. CPR initiated. He was given one round of epi and atropine with return of spontaneous circulation. Will add Levofed in addition to dopamine. CCU nurse practitioner kindly assisting with placement of central line.  [JS]  [phone] Asked by RT to reassess patient for increasing PIPs. Breath sounds heard bilaterally, right less than left. RT advance ET tube 3 cm. Saturations are 100%. Do not suspect pneumothorax. Will obtain repeat chest x-ray.  [JS]  0715 Repeat chest x-ray did not demonstrate pneumothorax. CT head did not demonstrate intracranial hemorrhage. Patient was transferred to CCU without incident.  [JS]    Clinical Course User Index [JS] Irean Hong, MD     ____________________________________________   FINAL CLINICAL IMPRESSION(S) / ED DIAGNOSES  Final diagnoses:  Cardiopulmonary arrest with successful resuscitation (HCC)      NEW MEDICATIONS STARTED DURING THIS VISIT:  There are no discharge medications for this patient.    Note:  This document was prepared using Dragon voice recognition software and may include unintentional dictation errors.    Irean Hong, MD 09/07/2016 [phone]

## 2016-09-01 NOTE — Consult Note (Signed)
Cardiology Consultation Note    Patient ID: Colton Dillon, MRN: [id number], DOB/AGE: [birth year] 51 y.o. Admit date: 08/29/2016   Date of Consult: 08/18/2016 Primary Physician: No PCP Per Patient Primary Cardiologist: None  Chief Complaint: Cardiac arrest Reason for Consultation: Cardiac arrest Requesting MD: Saundra Shelling, MD  HPI: Colton Dillon is a 51 y.o. male who is being seen today for the evaluation of cardiac arrest at the request of Dr. Estanislado Pandy. The patient is intubated and unresponsive. History is obtained from the patient's chart. He has a history of polysubstance abuse, COPD, and obesity, who was transported to [facility] after a witness fall and cardiac arrest. Colton Dillon' roommate observed the patient fall and strike his head. He subsequently called EMS some time later (reportedly an hour, though exact duration is unclear. Bystander CPR was not performed.EMS reportedly found the patient with pulseless electrical activity. Resuscitation was attempted for one hour, which time efforts were discontinued due to persistent PEA. However, upon reevaluation of rhythm, ventricular fibrillation was noted and the patient was successfully defibrillated with return of spontaneous circulation. In the emergency department, he was hypotensive requiring dopamine. He became profoundly bradycardic and pulseless requiring CPR again.  The patient was physically admitted to the ICU, where he was hypercapnic with accompanying severe metabolic acidosis and hypothermia. He is unresponsive.  Past medical history: COPD Polysubstance abuse Transitional cell cancer Aspiration pneumonia Obesity Sleep apnea on nocturnal BiPAP  Surgical History:  None on file. Unable to obtain from patient due to critical illness.  Home Meds: Prior to Admission medications   Not on File  Unable to obtain from patient due to critical illness.  Inpatient Medications:  . budesonide (PULMICORT) nebulizer solution  0.25 mg  Nebulization BID  . chlorhexidine gluconate (MEDLINE KIT)  15 mL Mouth Rinse BID  . insulin aspart  0-15 Units Subcutaneous Q4H  . ipratropium-albuterol  3 mL Nebulization Q4H  . mouth rinse  15 mL Mouth Rinse 10 times per day  . methylPREDNISolone (SOLU-MEDROL) injection  40 mg Intravenous Q12H  . sodium chloride flush  3 mL Intravenous Q12H   . norepinephrine    . sodium chloride 150 mL/hr at 08/31/2016 0907  . amiodarone 60 mg/hr (08/23/2016 0905)  . DOPamine 20 mcg/kg/min (08/27/2016 0803)  . famotidine (PEPCID) IV    . norepinephrine (LEVOPHED) Adult infusion 20 mcg/min (09/03/2016 0856)  . sodium chloride    . vasopressin (PITRESSIN) infusion - *FOR SHOCK* 0.03 Units/min (08/27/2016 4627)    Allergies: Allergies no known allergies  Social History   Social History  . Marital status: Unknown    Spouse name: N/A  . Number of children: N/A  . Years of education: N/A   Occupational History  . Not on file.   Social History Main Topics  . Smoking status: Not on file  . Smokeless tobacco: Not on file  . Alcohol use Not on file  . Drug use: Unknown  . Sexual activity: Not on file   Other Topics Concern  . Not on file   Social History Narrative  . No narrative on file     Family History  Problem Relation Age of Onset  . Family history unknown: Yes     Review of Systems: Unable to perform due to critical illness, with the patient unresponsive and mechanically ventilated.  Labs:  Recent Labs  08/18/2016 0347  TROPONINI 0.03*   Lab Results  Component Value Date   WBC 9.0 08/21/2016   HGB 11.7 (L) 08/17/2016  HCT 40.9 09/08/2016   MCV 93.2 08/18/2016   PLT 112 (L) 08/31/2016    Recent Labs Lab 08/27/2016 0347  NA 142  K 4.6  CL 84*  CO2 41*  BUN 9  CREATININE 1.10  CALCIUM 9.4  PROT 5.8*  BILITOT 0.6  ALKPHOS 102  ALT 173*  AST 306*  GLUCOSE 229*   No results found for: CHOL, HDL, LDLCALC, TRIG No results found for: DDIMER  Radiology/Studies:  Ct  Head Wo Contrast  Result Date: 09/03/2016 CLINICAL DATA:  Initial evaluation for acute trauma, fall. None known down time. PEA arrest with CPR. EXAM: CT HEAD WITHOUT CONTRAST TECHNIQUE: Contiguous axial images were obtained from the base of the skull through the vertex without intravenous contrast. COMPARISON:  None available. FINDINGS: Brain: Diffuse loss of cortical sulcation with poor differentiation of the gray-white matter differentiation, highly concerning for global cerebral edema, suspected to be related to diffuse anoxic injury. Ventricles remain relatively preserved at this time. Cerebellar tonsils mildly low lying within the foramen magnum, which may reflect early/ impinging trans tentorial herniation. Crowding of the basilar cisterns. Apparent diffuse hyperdensity involving the subarachnoid spaces favored to reflect pseudo subarachnoid hemorrhage related to global cerebral anoxia rather than acute blood. No evidence for acute parenchymal hemorrhage. No intraventricular hemorrhage. No extra-axial fluid collection. No mass lesion or midline shift. No hydrocephalus. No findings to suggest acute large vessel territory infarct. Vascular: Intracranial vascular root sure diffusely hyperdense, likely related to anoxia. No asymmetric hyperdense vessel. Vascular calcifications noted within the carotid siphons. Skull: Scalp soft tissues demonstrate no acute abnormality. Calvarium intact. Sinuses/Orbits: Globes and orbital soft tissues within normal limits. Scattered opacity with mucosal thickening throughout the visualized paranasal sinuses. Small bilateral mastoid effusions, right greater than left. IMPRESSION: 1. Diffuse loss of cortical sulcation and gray-white matter differentiation, highly concerning for global cerebral edema. Associated basilar cistern crowding with the cerebellar tonsils low lying within the foramen magnum, concerning for early transtentorial herniation. 2. Diffuse hyperdensity throughout  the subarachnoid spaces, favored to reflect "pseudo subarachnoid hemorrhage" related to global cerebral edema. No definite acute intracranial hemorrhage identified. Critical Value/emergent results were called by telephone at the time of interpretation on 08/22/2016 at 6:37 am to the nurse practitioner taking care of the patient, Marda Stalker, who verbally acknowledged these results. Electronically Signed   By: Jeannine Boga M.D.   On: 08/27/2016 06:42   Dg Chest Portable 1 View  Result Date: 08/27/2016 CLINICAL DATA:  Acute respiratory distress EXAM: PORTABLE CHEST 1 VIEW COMPARISON:  09/03/2016 FINDINGS: Endotracheal tube with tip measuring 5.9 cm above the carina. Normal heart size and pulmonary vascularity. Emphysematous changes in the lungs with scattered fibrosis. Linear fibrosis or atelectasis in the right mid lung. No blunting of costophrenic angles. No pneumothorax. No change since prior study. IMPRESSION: Appliances appear in satisfactory position. Emphysematous changes in the lungs with scattered fibrosis. No change. Electronically Signed   By: Lucienne Capers M.D.   On: 08/21/2016 06:12   Dg Chest Port 1 View  Result Date: 08/21/2016 CLINICAL DATA:  Patient fell, striking head. Arrest with CPR. Ventricular fibrillation. Endotracheal tube placement. EXAM: PORTABLE CHEST 1 VIEW COMPARISON:  None. FINDINGS: Endotracheal tube placed with tip measuring 6.5 cm above the carina. Heart size and pulmonary vascularity are normal. Emphysematous changes in the upper lungs. Linear scarring or atelectasis in the right mid lung. No pneumothorax. No blunting of costophrenic angles. No focal consolidation. IMPRESSION: Endotracheal tube is present with tip measuring 6.5 cm above the  carina. Emphysematous changes in the lungs. Linear fibrosis or atelectasis in the right mid lung. Electronically Signed   By: Lucienne Capers M.D.   On: 08/30/2016 04:14   Dg Abd Portable 1v  Result Date:  08/27/2016 CLINICAL DATA:  Assess feeding tube positioning. EXAM: PORTABLE ABDOMEN - 1 VIEW COMPARISON:  None in PACs FINDINGS: There is an esophagogastric tube present. The proximal port lies in the region of the gastric cardia and the tip in the mid gastric fundus. IMPRESSION: No feeding tube is visible. An esophagogastric tube is visible with positioning as described. Electronically Signed   By: David  Martinique M.D.   On: 09/03/2016 07:56    Wt Readings from Last 3 Encounters:  08/23/2016 253 lb 8.5 oz (115 kg)    EKG: Normal sinus rhythm with right bundle branch block. There are 1 mm ST elevations in aVR and V1 as well as anterolateral and inferior ST depressions. No prior tracing is available for comparison.  Physical Exam: Blood pressure (!) 71/59, pulse 71, temperature (!) 86.5 F (30.3 C), resp. rate 14, weight 253 lb 8.5 oz (115 kg), SpO2 (!) 87 %. There is no height or weight on file to calculate BMI. General: Obese man, lying in bed. He is intubated and unresponsive. He is notably cool to touch despite having warming blankets in place Head: Multiple small abrasions on the scalp. No conjunctival pallor. Endotracheal tube in place.  Neck: Negative for carotid bruits. Unable to assess JVP due to patient positioning and support devices. Lungs: Coarse breath sounds anteriorly. Heart: Distant heart sounds. Regular rate and rhythm without murmurs. Nondisplaced PMI. Abdomen: Absent bowel sounds. Abdomen is tense. Unable to assess hepatosplenomegaly due to body habitus. Msk:  Flaccid and unresponsive. Extremities: No lower extremity edema. Radial and pedal pulses are 1+ bilaterally. Neuro: Intubated and unresponsive. Psych:  Unable to perform as patient is intubated and unresponsive.     Assessment and Plan  51 year old man with history of COPD, polysubstance abuse, obesity, sleep apnea on nocturnal BiPAP, and transitional cell carcinoma, whom we have been asked to see following cardiac  arrest.  Cardiac arrest with pulseless electrical activity and ventricular fibrillation Patient is certainly at risk for coronary artery disease leading to malignant arrhythmia. Circumstances of his arrest remain unclear. History suggests prolonged period of no CPR followed by lengthy resuscitation by EMS. EKG demonstrates sinus rhythm with right bundle branch block and ST changes consistent with diffuse ischemia. In the setting of his prolonged cardiac arrest and hypothermia, it is difficult to know if this truly represents obstructive CAD or is a result of diffuse ischemia from his cardiac arrest. Given prolonged down time and head CT showing evidence of anoxic brain injury and elevated intracranial pressure, I do not feel that urgent cardiac catheterization is appropriate.  Continue amiodarone infusion to help suppress potential ventricular arrhythmias. Patient is not a candidate for beta blocker given hypotension.  Maintain potassium and magnesium levels greater than 4.0 and 2.0, respectively.  No further cardiac workup at this time. If there is evidence that the patient may have meaningful neurologic recovery, would consider obtaining transthoracic echocardiogram. Ischemia evaluation will be deferred at this time.  Shock This is likely multifactorial. An element of cardiogenic shock certainly may be contributing.  Continue vasopressor support, per critical care medicine.  Verlan Friends Datha Kissinger MD 09/09/2016, 9:23 AM Pager: [phone]

## 2016-09-01 NOTE — ED Notes (Addendum)
Airway placed by EDP Dolores Frame, RT Trey Paula. 19 , (+) color change

## 2016-09-01 NOTE — Progress Notes (Signed)
Patient arrived to ICU 20 at 61. Patient moved to ICU bed. Rectal Temp probe inserted after failed other temp methods. Temp of 30.2 C. Bairhugger applied. Patient lung sounds were diminished, pulses weak, patient unresponsive to stimuli.  Report given to [facility]

## 2016-09-01 NOTE — ED Notes (Signed)
Brady 34 bpm- Compressions started  0423- positive pulses

## 2016-09-01 NOTE — ED Notes (Signed)
[facility] responded to CPR in progress; no family present. [facility] support available if needed. Erline Levine 3:42 AM

## 2016-09-01 NOTE — H&P (Addendum)
[facility] - [place] at [facility]   PATIENT NAME: Colton Dillon    MR#:  [id number]  DATE OF BIRTH:  [birth year]  DATE OF ADMISSION:  Sep 15, 2016  PRIMARY CARE PHYSICIAN: No PCP Per Patient   REQUESTING/REFERRING PHYSICIAN:   CHIEF COMPLAINT:  No chief complaint on file.   HISTORY OF PRESENT ILLNESS: Colton Dillon  is a  51 yr old male patient with unknown past medical history presented to the emergency room after he was picked up by the EMS for unresponsiveness after a fall. Patient lives with a roommate. The down time after fall is not known,When EMS arrived at the scene, patient was having agonal breathing. ACLS protocol was run and patient was resuscitated for cardiac arrest. Initially patient had PEA rhythm and patient was resuscitated. Later on his rhythm changed to V. fib and he was shocked by the EMS in the field. Patient blood pressure and pulse was revived patient was also intubated for airway protection by the EMS. Patient was brought to the emergency room. No history could be obtained from the patient. Roommate could not be reached to obtain information on patient. Patient was started on IV dopamine drip in the emergency room for support of blood pressure and he is on ventilator. Patient coded in the emergency room and he was resuscitated again. Patient blood pressure and pulse were revived with CPR. Was started on IV Levophed drip in addition to IV dopamine drip and also on IV amiodarone drip. Hospitalist service was consulted for further care of the patient. Case was discussed with intensivist on call E link service and patient will be admitted to ICU.  PAST MEDICAL HISTORY:   Could not be obtained as patient is unresponsive and on ventilator  PAST SURGICAL HISTORY: Could not be obtained  SOCIAL HISTORY:  Could not be obtained as patient is unresponsive  FAMILY HISTORY:  Could not be obtained as patient is unresponsive  DRUG ALLERGIES:  [place]  REVIEW OF SYSTEMS:  Could not be obtained as patient is unresponsive and on ventilator.  MEDICATIONS AT HOME:  Prior to Admission medications   Not on File      PHYSICAL EXAMINATION:   VITAL SIGNS: Blood pressure 96/62, pulse 76, resp. rate (!) 22, SpO2 100 %.  GENERAL:  51 y.o.-year-old patient lying in the bed on ventilator  EYES: Pupils equal dilated, round, not reactive . No scleral icterus. Extraocular muscles intact.  HEENT: Head atraumatic, normocephalic. Oropharynx and nasopharynx clear.  NECK:  Supple, no jugular venous distention. No thyroid enlargement, no tenderness.  LUNGS: Normal breath sounds bilaterally, no wheezing, rales,rhonchi or crepitation. No use of accessory muscles of respiration.  CARDIOVASCULAR: S1, S2 heard, no murmurs, rubs ABDOMEN: Soft, nontender, nondistended. Bowel sounds present. No organomegaly or mass.  EXTREMITIES: No pedal edema, cyanosis, or clubbing.  NEUROLOGIC: Not oriented to time, place and person. On ventilator PSYCHIATRIC: could not be assessed SKIN: No obvious rash, lesion, or ulcer.   LABORATORY PANEL:   CBC  Recent Labs Lab 2016/09/15 0347  WBC 9.0  HGB 11.7*  HCT 40.9  PLT 112*  MCV 93.2  MCH 26.8  MCHC 28.7*  RDW 18.3*  LYMPHSABS PENDING  MONOABS PENDING  EOSABS PENDING  BASOSABS PENDING   ------------------------------------------------------------------------------------------------------------------  Chemistries   Recent Labs Lab Sep 15, 2016 0347  NA 142  K 4.6  CL 84*  CO2 41*  GLUCOSE 229*  BUN 9  CREATININE 1.10  CALCIUM 9.4  AST 306*  ALT 173*  ALKPHOS 102  BILITOT 0.6   ------------------------------------------------------------------------------------------------------------------ CrCl cannot be calculated (Unknown ideal weight.). ------------------------------------------------------------------------------------------------------------------ No results for input(s): TSH, T4TOTAL,  T3FREE, THYROIDAB in the last 72 hours.  Invalid input(s): FREET3   Coagulation profile  Recent Labs Lab 08/17/2016 0347  INR 1.02   ------------------------------------------------------------------------------------------------------------------- No results for input(s): DDIMER in the last 72 hours. -------------------------------------------------------------------------------------------------------------------  Cardiac Enzymes  Recent Labs Lab 08/27/2016 0347  TROPONINI 0.03*   ------------------------------------------------------------------------------------------------------------------ Invalid input(s): POCBNP  ---------------------------------------------------------------------------------------------------------------  Urinalysis No results found for: COLORURINE, APPEARANCEUR, LABSPEC, PHURINE, GLUCOSEU, HGBUR, BILIRUBINUR, KETONESUR, PROTEINUR, UROBILINOGEN, NITRITE, LEUKOCYTESUR   RADIOLOGY: Dg Chest Port 1 View  Result Date: 09/04/2016 CLINICAL DATA:  Patient fell, striking head. Arrest with CPR. Ventricular fibrillation. Endotracheal tube placement. EXAM: PORTABLE CHEST 1 VIEW COMPARISON:  None. FINDINGS: Endotracheal tube placed with tip measuring 6.5 cm above the carina. Heart size and pulmonary vascularity are normal. Emphysematous changes in the upper lungs. Linear scarring or atelectasis in the right mid lung. No pneumothorax. No blunting of costophrenic angles. No focal consolidation. IMPRESSION: Endotracheal tube is present with tip measuring 6.5 cm above the carina. Emphysematous changes in the lungs. Linear fibrosis or atelectasis in the right mid lung. Electronically Signed   By: Burman Nieves M.D.   On: 08/18/2016 04:14    EKG: Orders placed or performed during the hospital encounter of 08/20/2016  . ED EKG  . ED EKG    IMPRESSION AND PLAN: 51 year old male patient with unknown past medical history presented to the emergency room with  unresponsiveness and cardiac arrest. Admitting diagnosis 1. Cardiac arrest 2. Acute encephalopathy 3. Acute respiratory failure 4. Hypotension 5. Ventricular arrythmia 6. Abnormal liver function tests Treatment plan Admit patient to ICU Intensivist consultation Cardiology consultation Ventilator protocol for respiratory failure Continue IV dopamine drip, IV Levophed drip for support of blood pressure Continue IV amiodarone drip Check CT scan of the head to rule out any intracranial abnormality Supportive care  All the records are reviewed and case discussed with ED provider. Management plans discussed with the patient, family and they are in agreement.  CODE STATUS:FULL CODE Code Status History    This patient does not have a recorded code status. Please follow your organizational policy for patients in this situation.       TOTAL CRITICAL CARE TIME TAKING CARE OF THIS PATIENT: 56 minutes.    Ihor Austin M.D on 09/14/2016 at 5:45 AM  Between 7am to 6pm - Pager - [phone]  After 6pm go to [email or web address] - password [email or web address] [facility]  [facility]  Office  [phone]  CC: Primary care physician; No PCP Per Patient

## 2016-09-02 DIAGNOSIS — G9382 Brain death: Secondary | ICD-10-CM

## 2016-09-02 LAB — BLOOD GAS, ARTERIAL
FIO2: 0.6
FIO2: 1
LHR: 14 {breaths}/min
MECHVT: 500 mL
Mechanical Rate: 20
PCO2 ART: 120 mmHg — AB (ref 32.0–48.0)
PEEP/CPAP: 5 cmH2O
PEEP: 5 cmH2O
PH ART: 7.05 — AB (ref 7.350–7.450)
PO2 ART: 202 mmHg — AB (ref 83.0–108.0)
Patient temperature: 37
Patient temperature: 37
RATE: 20 resp/min
VT: 400 mL
pCO2 arterial: 120 mmHg (ref 32.0–48.0)
pH, Arterial: 7.01 — CL (ref 7.350–7.450)

## 2016-09-02 LAB — GLUCOSE, CAPILLARY
GLUCOSE-CAPILLARY: 114 mg/dL — AB (ref 65–99)
Glucose-Capillary: 127 mg/dL — ABNORMAL HIGH (ref 65–99)

## 2016-09-02 MED ORDER — FENTANYL CITRATE (PF) 100 MCG/2ML IJ SOLN
INTRAMUSCULAR | Status: AC
  Filled 2016-09-02: qty 2

## 2016-09-02 MED ORDER — MIDAZOLAM HCL 2 MG/2ML IJ SOLN
INTRAMUSCULAR | Status: AC
  Filled 2016-09-02: qty 2

## 2016-09-02 MED FILL — Medication: Qty: 1 | Status: AC

## 2016-09-15 NOTE — Progress Notes (Signed)
Remains fully unresponsive No spontaneous respiratory efforts Pupils dilaed and fixed Doll's eyes absent Corneal reflexes absent DTRs absent No withdrawal from painful stimuli  MODS after prolonged cardiac arrest Brain death after prolonged cardiac arrest  Family apprised CDS has evaluated We will proceed with discontinuation of vent support  Billy Fischer, MD PCCM service Mobile [phone] Pager [phone] 14-Sep-2016

## 2016-09-15 NOTE — Progress Notes (Signed)
Ventilator support terminated.  ET tube left in place due to this is to be a medical examiner case.

## 2016-09-15 NOTE — Discharge Summary (Signed)
DEATH SUMMARY  DATE OF ADMISSION:  09-04-16  DATE OF DISCHARGE/DEATH:  09-05-2016  ADMISSION DIAGNOSES:   Prolonged cardiac arrest Ventilator dependence Chronic hypoxic respiratory PEA, V fib Cardiogenic shock Severe lactic acidosis Anuric renal failure Mild anemia without acute blood loss Thrombocytopenia Elevated LFTs Hyperglycemia Hypothermia Exam consistent with brain death  DISCHARGE DIAGNOSES:   S/P prolonged cardiac arrest - PEA, ventricular fibrillation Ventilator dependence Chronic hypoxic respiratory Cardiogenic shock Severe lactic acidosis Anuric renal failure Mild anemia without acute blood loss Thrombocytopenia Elevated LFTs Hyperglycemia Hypothermia - resolved Brain death  PRESENTATION:   Pt was admitted with the following HPI and the above admission diagnoses:  51 yr old male patient with unknown past medical history presented to the emergency room after he was picked up by the EMS for unresponsiveness after a fall. Patient lives with a roommate. The down time after fall is not known. When EMS arrived at the scene, patient was having agonal breathing. ACLS protocol was run and patient was resuscitated for cardiac arrest. Initially patient had PEA rhythm and patient was resuscitated. Later on his rhythm changed to V. fib and he was shocked by the EMS in the field. Patient blood pressure and pulse was revived patient was also intubated for airway protection by the EMS. Patient was brought to the emergency room. No history could be obtained from the patient. Roommate could not be reached to obtain information on patient. Patient was started on IV dopamine drip in the emergency room for support of blood pressure and he is on ventilator. Patient coded in the emergency room and he was resuscitated again. Patient blood pressure and pulse were revived with CPR. Was started on IV Levophed drip in addition to IV dopamine drip and also on IV amiodarone drip. Hospitalist service  was consulted for further care of the patient. Case was discussed with intensivist on call E link service and patient will be admitted to ICU.  HOSPITAL COURSE:   He underwent prolonged resuscitative efforts in the field and in the ED. His exam was c/w brain death AM 05-Sep-2022. However, he was hypothermic at that time. He was on maximum doses of 3 vasopressors and was anuric. He was made DNR after discussion with pt's HCPOA, sister. On the day following admission, he had been rewarmed and his exam continued to be consistent with brain death. Ventilator support was withdrawn and cardiac death ensued shortly thereafter. ME was notified  CT head Sep 04, 2016: 1. Diffuse loss of cortical sulcation and gray-white matter differentiation, highly concerning for global cerebral edema. Associated basilar cistern crowding with the cerebellar tonsils low lying within the foramen magnum, concerning for early transtentorial herniation. 2. Diffuse hyperdensity throughout the subarachnoid spaces, favored to reflect "pseudo subarachnoid hemorrhage" related to global cerebral edema. No definite acute intracranial hemorrhage identified.  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

## 2016-09-15 NOTE — Progress Notes (Signed)
Pt has remained unresponsive, pupils non-reactive. NSR on cardiac monitor. Vaso weaned off early this am. Pt has remained on 60% FiO2, SpO2 > 90%, lung sounds diminished.  Plans to terminally extubate this am when family arrives.

## 2016-09-15 DEATH — deceased

## 2017-09-01 IMAGING — CT CT HEAD W/O CM
3 series · 14 of 47 positions shown, 16 images · non-contrast
Comparison: None available.

CLINICAL DATA: Initial evaluation for acute trauma, fall. None
known down time. WAI NOK arrest with CPR.

EXAM:
CT HEAD WITHOUT CONTRAST
TECHNIQUE: Contiguous axial images were obtained from the base of the skull
through the vertex without intravenous contrast.

[Series 3: ax head wo · axial · 0.39mm/px · z∈[-96,+34]mm · 8 of 32 slices shown, 10 images]
[im 3/32  brain]
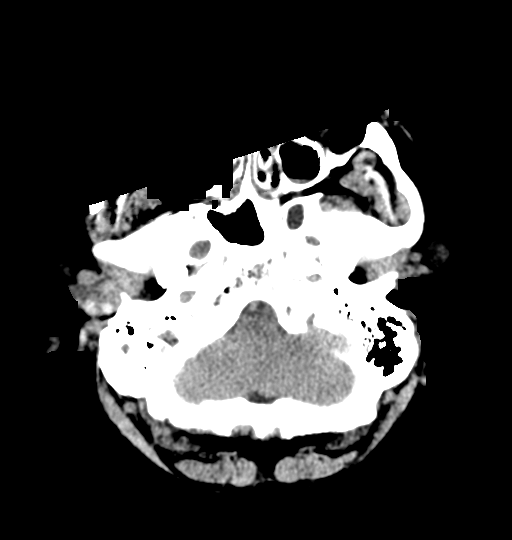
[im 3/32  bone]
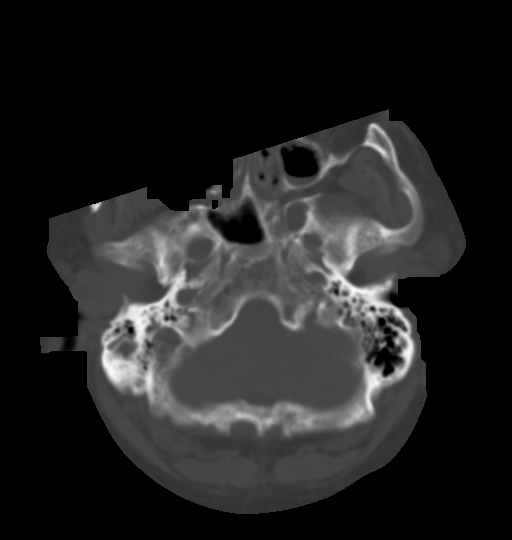
[im 7/32  brain]
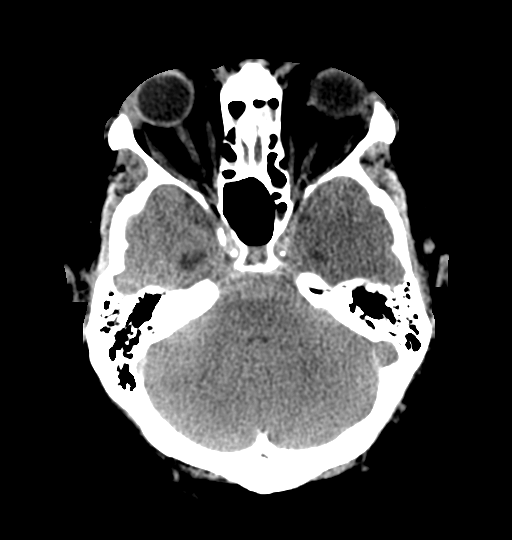
[im 10/32  brain]
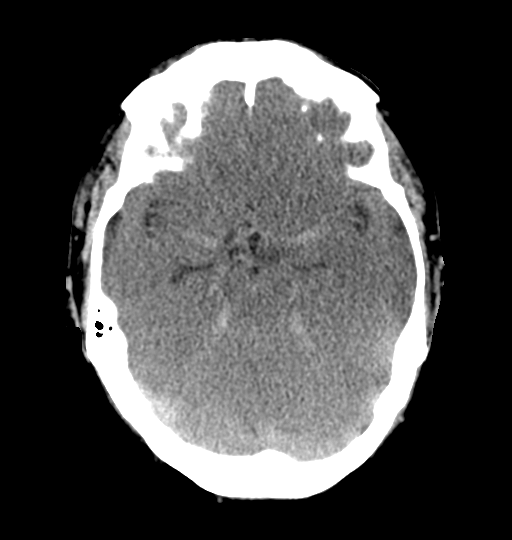
[im 14/32  brain]
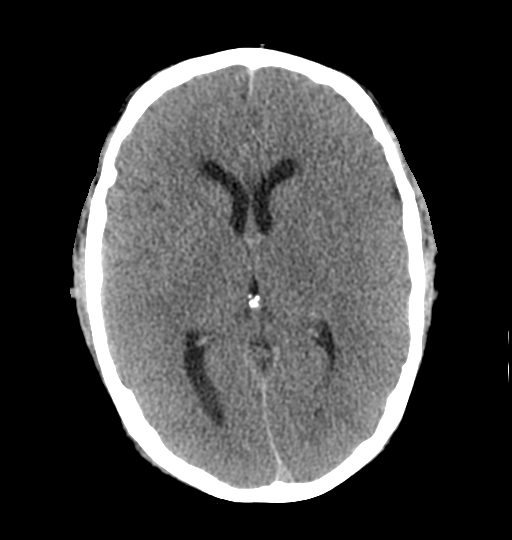
[im 18/32  brain]
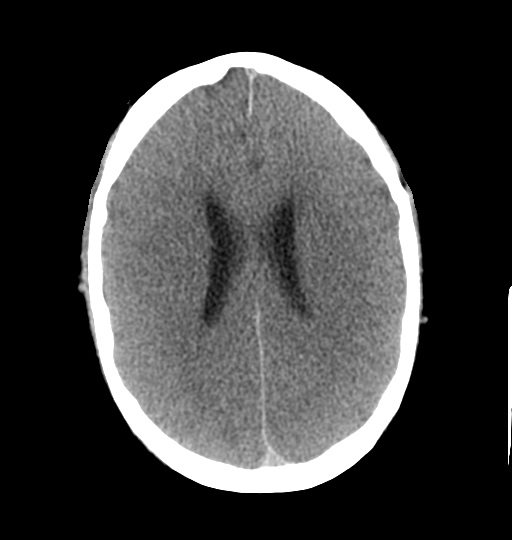
[im 18/32  bone]
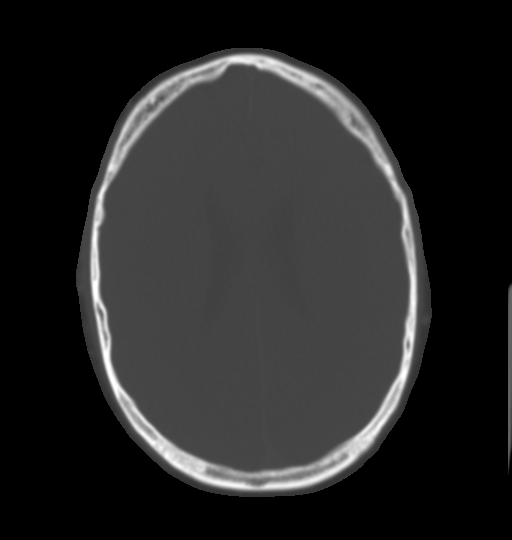
[im 22/32  brain]
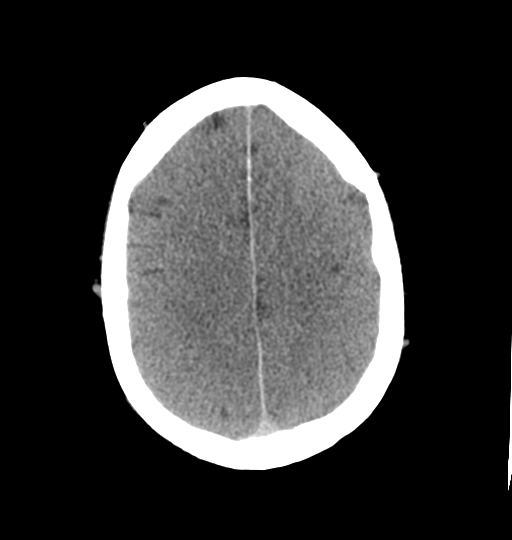
[im 25/32  brain]
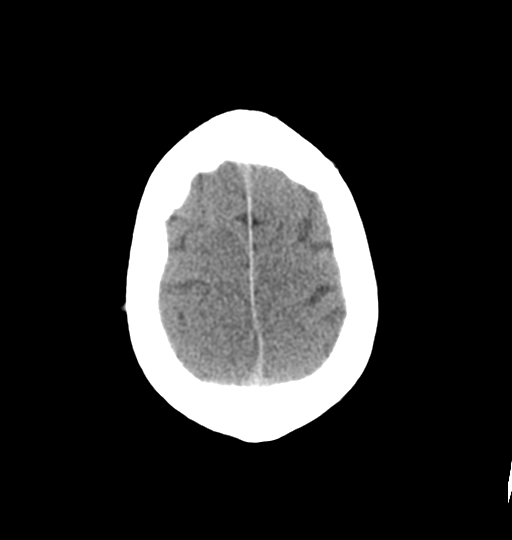
[im 29/32  brain]
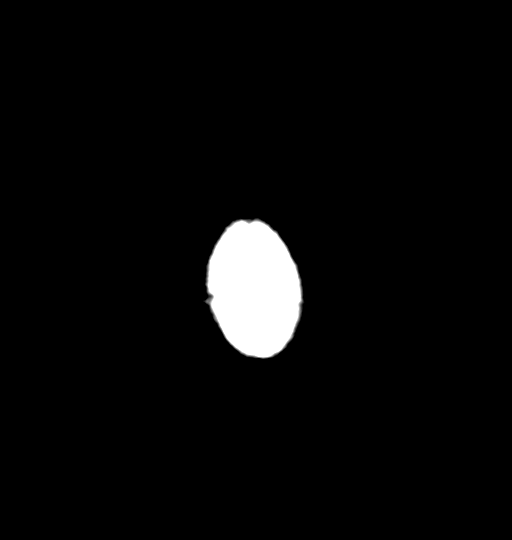

[Series 5: coronal soft tissue · coronal · 0.33mm/px · 3 of 63 slices shown]
[im 21/63  brain]
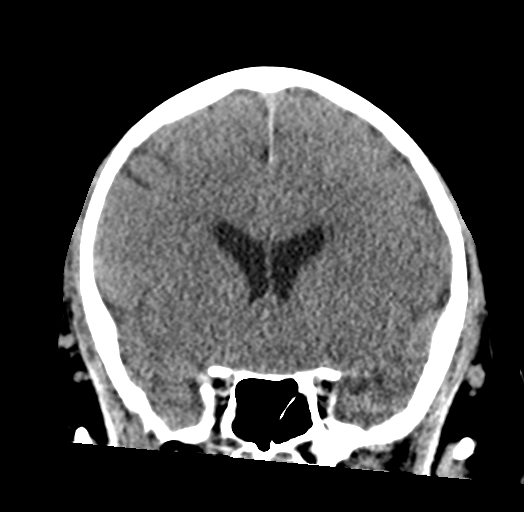
[im 28/63  brain]
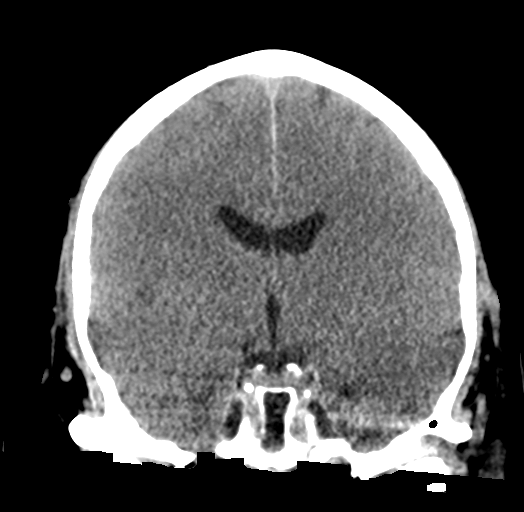
[im 35/63  brain]
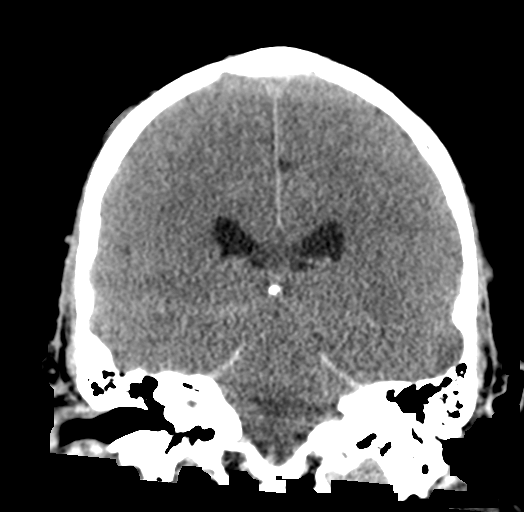

[Series 6: sagittal soft tissue · sagittal · 0.32mm/px · 3 of 55 slices shown]
[im 19/55  brain]
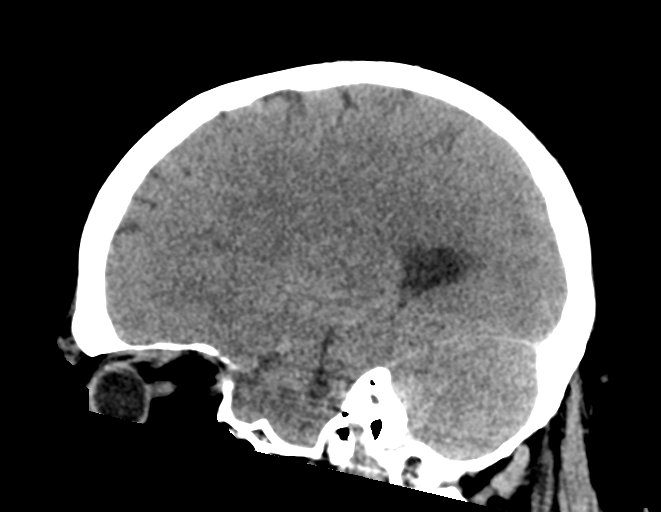
[im 28/55  brain]
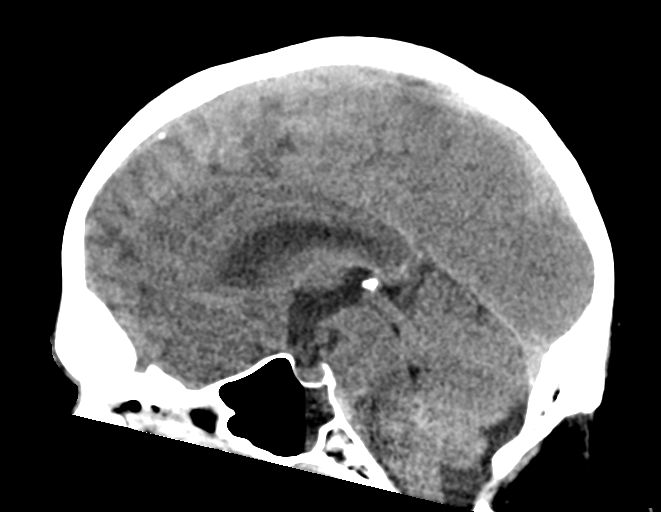
[im 37/55  brain]
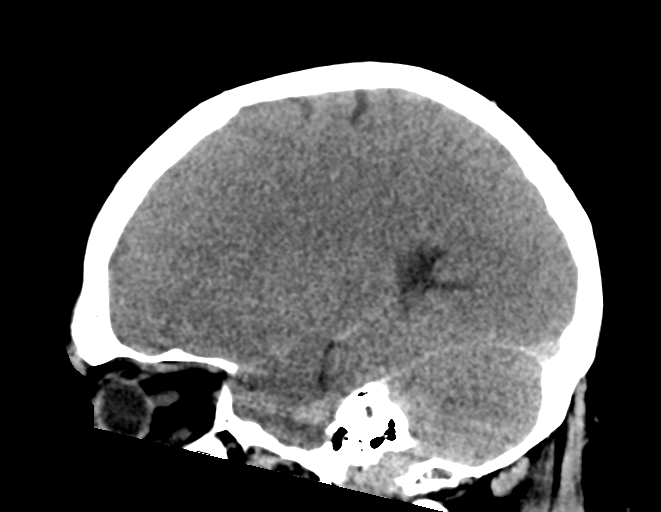

[14 of 47 positions shown; findings below may reference images not displayed]

FINDINGS: Brain: Diffuse loss of cortical sulcation with poor differentiation
of the gray-white matter differentiation, highly concerning for
global cerebral edema, suspected to be related to diffuse anoxic
injury. Ventricles remain relatively preserved at this time.
Cerebellar tonsils mildly low lying within the foramen magnum, which
may reflect early/ impinging trans tentorial herniation. Crowding of
the basilar cisterns.

Apparent diffuse hyperdensity involving the subarachnoid spaces
favored to reflect pseudo subarachnoid hemorrhage related to global
cerebral anoxia rather than acute blood. No evidence for acute
parenchymal hemorrhage. No intraventricular hemorrhage. No
extra-axial fluid collection. No mass lesion or midline shift. No
hydrocephalus. No findings to suggest acute large vessel territory
infarct.

Vascular: Intracranial vascular root sure diffusely hyperdense,
likely related to anoxia. No asymmetric hyperdense vessel. Vascular
calcifications noted within the carotid siphons.

Skull: Scalp soft tissues demonstrate no acute abnormality.
Calvarium intact.

Sinuses/Orbits: Globes and orbital soft tissues within normal
limits. Scattered opacity with mucosal thickening throughout the
visualized paranasal sinuses. Small bilateral mastoid effusions,
right greater than left.
IMPRESSION: 1. Diffuse loss of cortical sulcation and gray-white matter
differentiation, highly concerning for global cerebral edema.
Associated basilar cistern crowding with the cerebellar tonsils low
lying within the foramen magnum, concerning for early transtentorial
herniation.
2. Diffuse hyperdensity throughout the subarachnoid spaces, favored
to reflect "pseudo subarachnoid hemorrhage" related to global
cerebral edema. No definite acute intracranial hemorrhage
identified.
Critical Value/emergent results were called by telephone at the time
of interpretation on 09/01/2016 at [DATE] to the nurse practitioner
taking care of the patient, Ivo Ivica Mavra, who verbally acknowledged
these results.
